# Patient Record
Sex: Female | Born: 1940 | Race: White | Hispanic: Yes
Health system: Southern US, Community
[De-identification: ages and names within clinical notes are randomized; demographics above are authoritative.]

## PROBLEM LIST (undated history)

## (undated) HISTORY — PX: BREAST EXCISIONAL BIOPSY: SUR124

---

## 2003-12-04 ENCOUNTER — Encounter: Admission: RE | Admit: 2003-12-04 | Discharge: 2003-12-04 | Payer: Self-pay | Admitting: Family Medicine

## 2005-02-10 ENCOUNTER — Encounter: Admission: RE | Admit: 2005-02-10 | Discharge: 2005-02-10 | Payer: Self-pay | Admitting: Family Medicine

## 2006-03-17 ENCOUNTER — Encounter: Admission: RE | Admit: 2006-03-17 | Discharge: 2006-03-17 | Payer: Self-pay | Admitting: Family Medicine

## 2006-04-11 ENCOUNTER — Encounter: Admission: RE | Admit: 2006-04-11 | Discharge: 2006-04-11 | Payer: Self-pay | Admitting: Family Medicine

## 2007-09-26 ENCOUNTER — Ambulatory Visit (HOSPITAL_COMMUNITY): Admission: RE | Admit: 2007-09-26 | Discharge: 2007-09-26 | Payer: Self-pay | Admitting: Family Medicine

## 2008-09-26 ENCOUNTER — Ambulatory Visit (HOSPITAL_COMMUNITY): Admission: RE | Admit: 2008-09-26 | Discharge: 2008-09-26 | Payer: Self-pay | Admitting: Family Medicine

## 2010-05-08 ENCOUNTER — Encounter: Admission: RE | Admit: 2010-05-08 | Discharge: 2010-05-08 | Payer: Self-pay | Admitting: Family Medicine

## 2010-12-26 ENCOUNTER — Encounter: Payer: Self-pay | Admitting: Family Medicine

## 2011-04-26 ENCOUNTER — Other Ambulatory Visit: Payer: Self-pay | Admitting: Family Medicine

## 2011-04-26 DIAGNOSIS — Z1231 Encounter for screening mammogram for malignant neoplasm of breast: Secondary | ICD-10-CM

## 2011-05-12 ENCOUNTER — Ambulatory Visit
Admission: RE | Admit: 2011-05-12 | Discharge: 2011-05-12 | Disposition: A | Discharge status: Home / Self Care | Payer: Medicare Other | Source: Ambulatory Visit | Attending: Family Medicine | Admitting: Family Medicine

## 2011-05-12 DIAGNOSIS — Z1231 Encounter for screening mammogram for malignant neoplasm of breast: Secondary | ICD-10-CM

## 2012-05-18 ENCOUNTER — Other Ambulatory Visit: Payer: Self-pay | Admitting: Family Medicine

## 2012-05-18 DIAGNOSIS — Z1231 Encounter for screening mammogram for malignant neoplasm of breast: Secondary | ICD-10-CM

## 2012-05-31 ENCOUNTER — Ambulatory Visit
Admission: RE | Admit: 2012-05-31 | Discharge: 2012-05-31 | Disposition: A | Discharge status: Home / Self Care | Payer: Medicare PPO | Source: Ambulatory Visit | Attending: Family Medicine | Admitting: Family Medicine

## 2012-05-31 DIAGNOSIS — Z1231 Encounter for screening mammogram for malignant neoplasm of breast: Secondary | ICD-10-CM

## 2012-06-01 ENCOUNTER — Other Ambulatory Visit: Payer: Self-pay | Admitting: Family Medicine

## 2012-06-01 DIAGNOSIS — Z780 Asymptomatic menopausal state: Secondary | ICD-10-CM

## 2012-06-01 DIAGNOSIS — M810 Age-related osteoporosis without current pathological fracture: Secondary | ICD-10-CM

## 2012-06-05 ENCOUNTER — Ambulatory Visit
Admission: RE | Admit: 2012-06-05 | Discharge: 2012-06-05 | Disposition: A | Discharge status: Home / Self Care | Payer: Medicare PPO | Source: Ambulatory Visit | Attending: Family Medicine | Admitting: Family Medicine

## 2012-06-05 DIAGNOSIS — Z780 Asymptomatic menopausal state: Secondary | ICD-10-CM

## 2012-06-05 DIAGNOSIS — M810 Age-related osteoporosis without current pathological fracture: Secondary | ICD-10-CM

## 2013-07-23 ENCOUNTER — Other Ambulatory Visit: Payer: Self-pay

## 2013-07-23 DIAGNOSIS — Z1231 Encounter for screening mammogram for malignant neoplasm of breast: Secondary | ICD-10-CM

## 2013-08-03 ENCOUNTER — Ambulatory Visit
Admission: RE | Admit: 2013-08-03 | Discharge: 2013-08-03 | Disposition: A | Discharge status: Home / Self Care | Payer: Medicare PPO | Source: Ambulatory Visit

## 2013-08-03 DIAGNOSIS — Z1231 Encounter for screening mammogram for malignant neoplasm of breast: Secondary | ICD-10-CM

## 2014-07-30 ENCOUNTER — Other Ambulatory Visit: Payer: Self-pay

## 2014-07-30 DIAGNOSIS — Z1231 Encounter for screening mammogram for malignant neoplasm of breast: Secondary | ICD-10-CM

## 2014-08-07 ENCOUNTER — Ambulatory Visit
Admission: RE | Admit: 2014-08-07 | Discharge: 2014-08-07 | Disposition: A | Discharge status: Home / Self Care | Payer: Medicare PPO | Source: Ambulatory Visit

## 2014-08-07 DIAGNOSIS — Z1231 Encounter for screening mammogram for malignant neoplasm of breast: Secondary | ICD-10-CM

## 2014-08-28 ENCOUNTER — Other Ambulatory Visit: Payer: Self-pay

## 2014-08-28 DIAGNOSIS — M810 Age-related osteoporosis without current pathological fracture: Secondary | ICD-10-CM

## 2014-08-30 ENCOUNTER — Other Ambulatory Visit: Payer: Self-pay | Admitting: Family Medicine

## 2014-08-30 DIAGNOSIS — M810 Age-related osteoporosis without current pathological fracture: Secondary | ICD-10-CM

## 2014-09-04 ENCOUNTER — Ambulatory Visit
Admission: RE | Admit: 2014-09-04 | Discharge: 2014-09-04 | Disposition: A | Discharge status: Home / Self Care | Payer: Medicare PPO | Source: Ambulatory Visit

## 2014-09-04 DIAGNOSIS — M810 Age-related osteoporosis without current pathological fracture: Secondary | ICD-10-CM

## 2015-07-15 ENCOUNTER — Other Ambulatory Visit: Payer: Self-pay

## 2015-07-15 DIAGNOSIS — Z1231 Encounter for screening mammogram for malignant neoplasm of breast: Secondary | ICD-10-CM

## 2015-08-13 ENCOUNTER — Ambulatory Visit
Admission: RE | Admit: 2015-08-13 | Discharge: 2015-08-13 | Disposition: A | Discharge status: Home / Self Care | Payer: Medicare PPO | Source: Ambulatory Visit

## 2015-08-13 DIAGNOSIS — Z1231 Encounter for screening mammogram for malignant neoplasm of breast: Secondary | ICD-10-CM

## 2016-08-02 ENCOUNTER — Other Ambulatory Visit: Payer: Self-pay | Admitting: Family Medicine

## 2016-08-02 DIAGNOSIS — Z1231 Encounter for screening mammogram for malignant neoplasm of breast: Secondary | ICD-10-CM

## 2016-08-24 ENCOUNTER — Ambulatory Visit
Admission: RE | Admit: 2016-08-24 | Discharge: 2016-08-24 | Disposition: A | Discharge status: Home / Self Care | Payer: Medicare PPO | Source: Ambulatory Visit | Attending: Family Medicine | Admitting: Family Medicine

## 2016-08-24 DIAGNOSIS — Z1231 Encounter for screening mammogram for malignant neoplasm of breast: Secondary | ICD-10-CM

## 2017-05-17 ENCOUNTER — Other Ambulatory Visit: Payer: Self-pay | Admitting: Family Medicine

## 2017-05-17 DIAGNOSIS — M8580 Other specified disorders of bone density and structure, unspecified site: Secondary | ICD-10-CM

## 2017-05-25 ENCOUNTER — Other Ambulatory Visit: Payer: Medicare PPO

## 2017-06-02 ENCOUNTER — Ambulatory Visit
Admission: RE | Admit: 2017-06-02 | Discharge: 2017-06-02 | Disposition: A | Discharge status: Home / Self Care | Payer: Medicare PPO | Source: Ambulatory Visit | Attending: Family Medicine | Admitting: Family Medicine

## 2017-06-02 DIAGNOSIS — M8580 Other specified disorders of bone density and structure, unspecified site: Secondary | ICD-10-CM

## 2017-08-15 ENCOUNTER — Other Ambulatory Visit: Payer: Self-pay | Admitting: Family Medicine

## 2017-08-15 DIAGNOSIS — Z1231 Encounter for screening mammogram for malignant neoplasm of breast: Secondary | ICD-10-CM

## 2017-08-26 ENCOUNTER — Ambulatory Visit
Admission: RE | Admit: 2017-08-26 | Discharge: 2017-08-26 | Disposition: A | Discharge status: Home / Self Care | Payer: Medicare PPO | Source: Ambulatory Visit | Attending: Family Medicine | Admitting: Family Medicine

## 2017-08-26 DIAGNOSIS — Z1231 Encounter for screening mammogram for malignant neoplasm of breast: Secondary | ICD-10-CM

## 2018-08-25 ENCOUNTER — Other Ambulatory Visit: Payer: Self-pay | Admitting: Internal Medicine

## 2018-08-25 DIAGNOSIS — Z1231 Encounter for screening mammogram for malignant neoplasm of breast: Secondary | ICD-10-CM

## 2018-09-22 ENCOUNTER — Ambulatory Visit
Admission: RE | Admit: 2018-09-22 | Discharge: 2018-09-22 | Disposition: A | Discharge status: Home / Self Care | Payer: Medicare PPO | Source: Ambulatory Visit | Attending: Internal Medicine | Admitting: Internal Medicine

## 2018-09-22 DIAGNOSIS — Z1231 Encounter for screening mammogram for malignant neoplasm of breast: Secondary | ICD-10-CM

## 2019-08-21 ENCOUNTER — Other Ambulatory Visit: Payer: Self-pay | Admitting: Internal Medicine

## 2019-08-21 DIAGNOSIS — Z1231 Encounter for screening mammogram for malignant neoplasm of breast: Secondary | ICD-10-CM

## 2019-10-08 ENCOUNTER — Ambulatory Visit
Admission: RE | Admit: 2019-10-08 | Discharge: 2019-10-08 | Disposition: A | Discharge status: Home / Self Care | Payer: Medicare PPO | Source: Ambulatory Visit | Attending: Internal Medicine | Admitting: Internal Medicine

## 2019-10-08 ENCOUNTER — Other Ambulatory Visit: Payer: Self-pay

## 2019-10-08 DIAGNOSIS — Z1231 Encounter for screening mammogram for malignant neoplasm of breast: Secondary | ICD-10-CM

## 2020-01-31 ENCOUNTER — Ambulatory Visit: Payer: Medicare PPO | Attending: Internal Medicine

## 2020-01-31 DIAGNOSIS — Z23 Encounter for immunization: Secondary | ICD-10-CM | POA: Insufficient documentation

## 2020-01-31 NOTE — Progress Notes (Signed)
   Covid-19 Vaccination Clinic  Name:  Alejandra Hurst    MRN: [id number] DOB: 1941/09/30  01/31/2020  Ms. Alejandra Hurst was observed post Covid-19 immunization for 15 minutes without incidence. She was provided with Vaccine Information Sheet and instruction to access the V-Safe system.   Ms. Alejandra Hurst was instructed to call 911 with any severe reactions post vaccine: Marland Kitchen Difficulty breathing  . Swelling of your face and throat  . A fast heartbeat  . A bad rash all over your body  . Dizziness and weakness    Immunizations Administered    Name Date Dose VIS Date Route   Pfizer COVID-19 Vaccine 01/31/2020  2:49 PM 0.3 mL 11/16/2019 Intramuscular   Manufacturer: [employer], [employer]   Lot: [id number]   NDC: [address]-7

## 2020-02-26 ENCOUNTER — Ambulatory Visit: Payer: Medicare PPO | Attending: Internal Medicine

## 2020-02-26 DIAGNOSIS — Z23 Encounter for immunization: Secondary | ICD-10-CM

## 2020-02-26 NOTE — Progress Notes (Signed)
   Covid-19 Vaccination Clinic  Name:  Alejandra Hurst    MRN: [id number] DOB: 1941/03/29  02/26/2020  Alejandra Hurst was observed post Covid-19 immunization for 15 minutes without incident. She was provided with Vaccine Information Sheet and instruction to access the V-Safe system.   Alejandra Hurst was instructed to call 911 with any severe reactions post vaccine: Marland Kitchen Difficulty breathing  . Swelling of face and throat  . A fast heartbeat  . A bad rash all over body  . Dizziness and weakness   Immunizations Administered    Name Date Dose VIS Date Route   Pfizer COVID-19 Vaccine 02/26/2020  2:46 PM 0.3 mL 11/16/2019 Intramuscular   Manufacturer: [employer], [employer]   Lot: [id number]   NDC: [address]-3

## 2021-03-18 ENCOUNTER — Other Ambulatory Visit: Payer: Self-pay | Admitting: Internal Medicine

## 2021-03-18 DIAGNOSIS — Z1231 Encounter for screening mammogram for malignant neoplasm of breast: Secondary | ICD-10-CM

## 2021-03-18 DIAGNOSIS — E2839 Other primary ovarian failure: Secondary | ICD-10-CM

## 2021-04-17 ENCOUNTER — Other Ambulatory Visit: Payer: Self-pay

## 2021-04-17 ENCOUNTER — Ambulatory Visit
Admission: RE | Admit: 2021-04-17 | Discharge: 2021-04-17 | Disposition: A | Discharge status: Home / Self Care | Payer: Medicare PPO | Source: Ambulatory Visit | Attending: Internal Medicine | Admitting: Internal Medicine

## 2021-04-17 DIAGNOSIS — Z1231 Encounter for screening mammogram for malignant neoplasm of breast: Secondary | ICD-10-CM

## 2021-04-17 DIAGNOSIS — E2839 Other primary ovarian failure: Secondary | ICD-10-CM

## 2021-08-04 ENCOUNTER — Ambulatory Visit: Payer: Medicare PPO

## 2022-04-05 ENCOUNTER — Other Ambulatory Visit: Payer: Self-pay | Admitting: Internal Medicine

## 2022-04-05 DIAGNOSIS — Z1231 Encounter for screening mammogram for malignant neoplasm of breast: Secondary | ICD-10-CM

## 2022-04-19 ENCOUNTER — Ambulatory Visit
Admission: RE | Admit: 2022-04-19 | Discharge: 2022-04-19 | Disposition: A | Discharge status: Home / Self Care | Payer: Medicare PPO | Source: Ambulatory Visit | Attending: Internal Medicine | Admitting: Internal Medicine

## 2022-04-19 ENCOUNTER — Other Ambulatory Visit: Payer: Self-pay | Admitting: Internal Medicine

## 2022-04-19 DIAGNOSIS — Z1231 Encounter for screening mammogram for malignant neoplasm of breast: Secondary | ICD-10-CM

## 2022-11-14 IMAGING — MG MM DIGITAL SCREENING BILAT W/ TOMO AND CAD
8 series · 9 of 24 positions shown · non-contrast
Comparison: Previous exam(s).

CLINICAL DATA: Screening.

EXAM:
DIGITAL SCREENING BILATERAL MAMMOGRAM WITH TOMOSYNTHESIS AND CAD
TECHNIQUE: Bilateral screening digital craniocaudal and mediolateral oblique
mammograms were obtained. Bilateral screening digital breast
tomosynthesis was performed. The images were evaluated with
computer-aided detection.

[L MLO synth-2D]
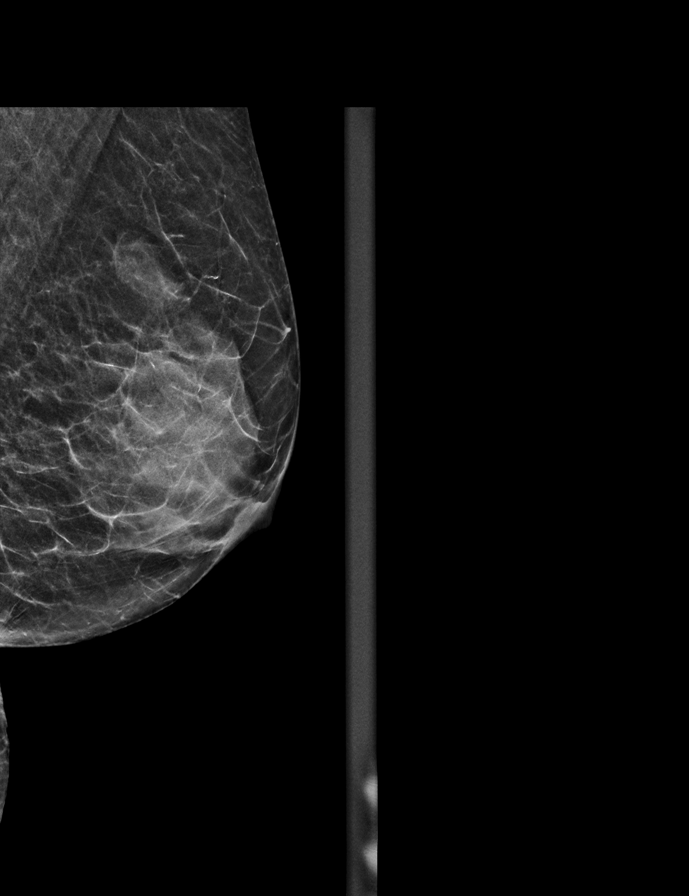

[R CC synth-2D]
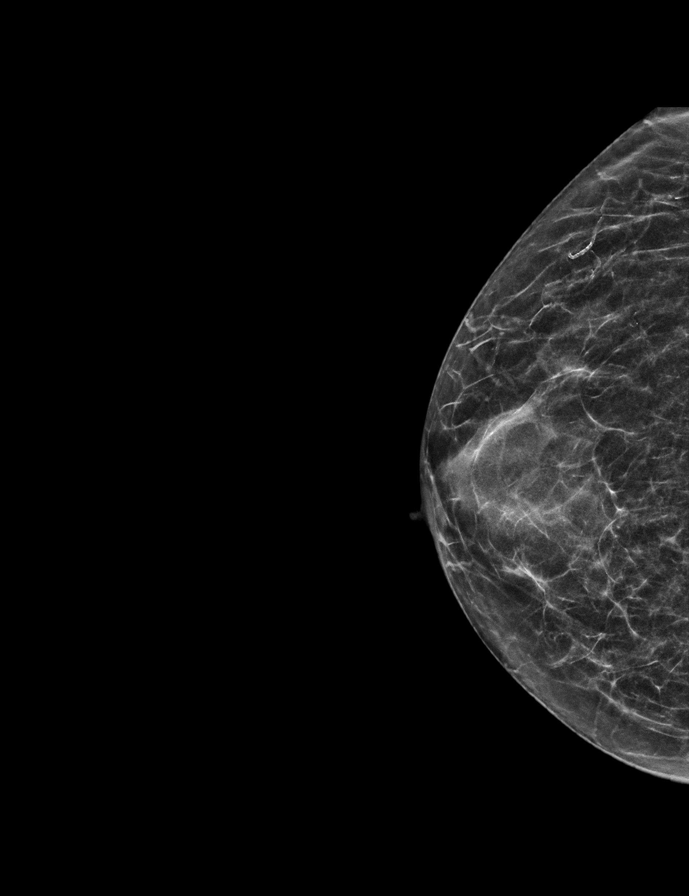

[L CC synth-2D]
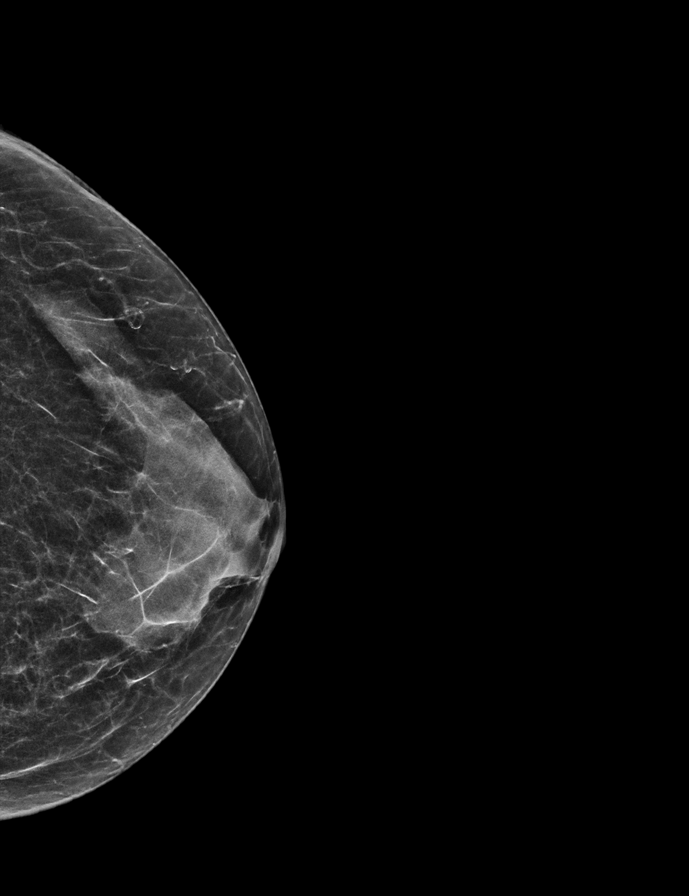

[R MLO synth-2D]
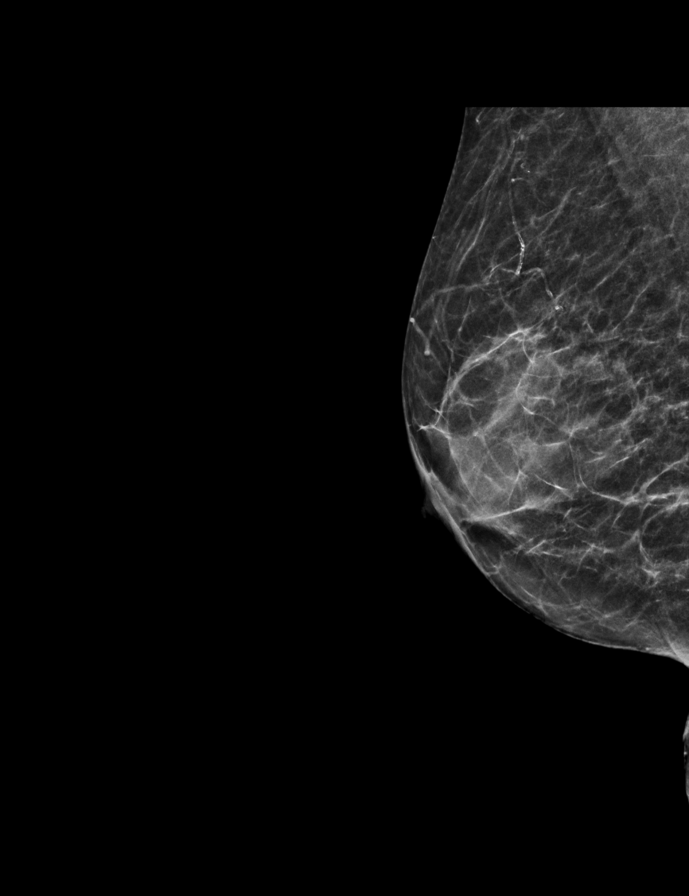

[L CC tomo · 2 of 44 frames shown]
[frame 15/44]
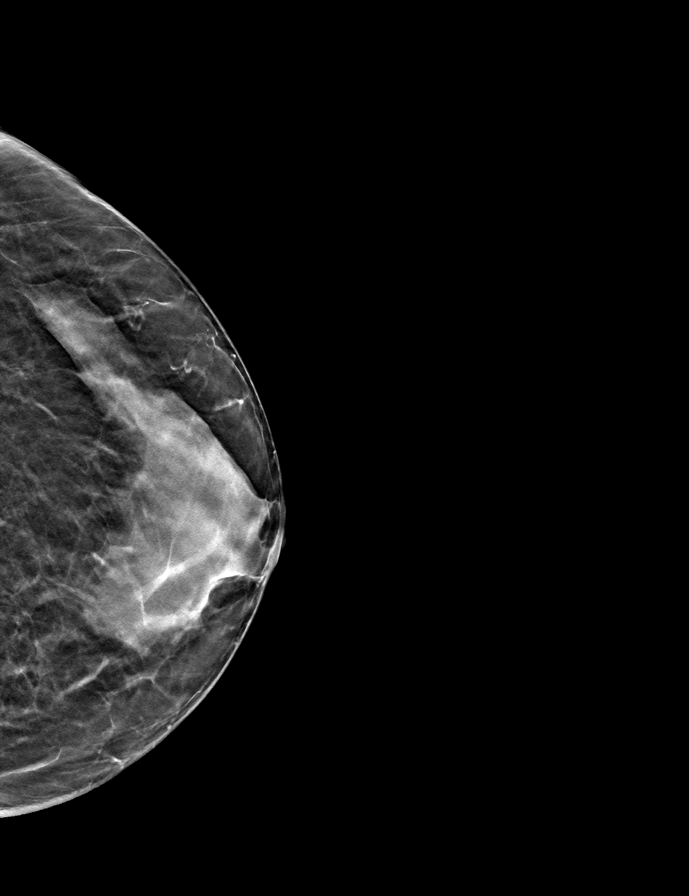
[frame 23/44]
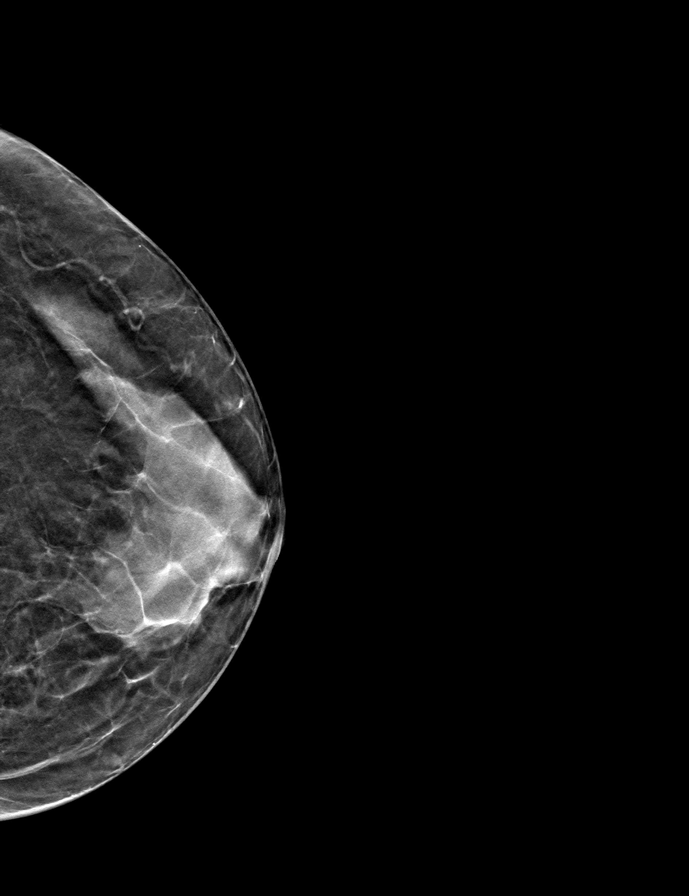

[R MLO tomo · tomo slice 22/43.0]
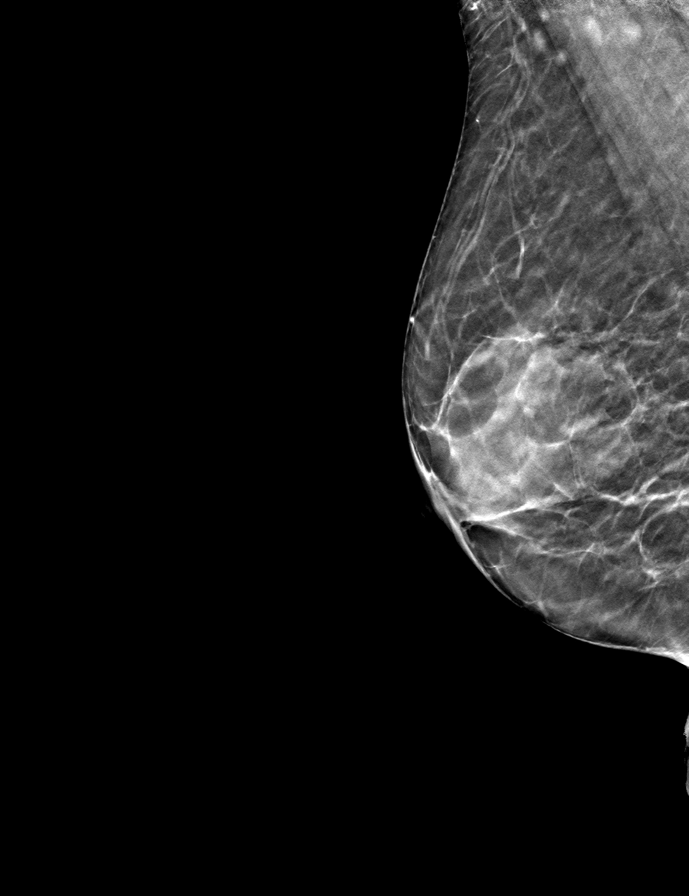

[L MLO tomo · tomo slice 22/43.0]
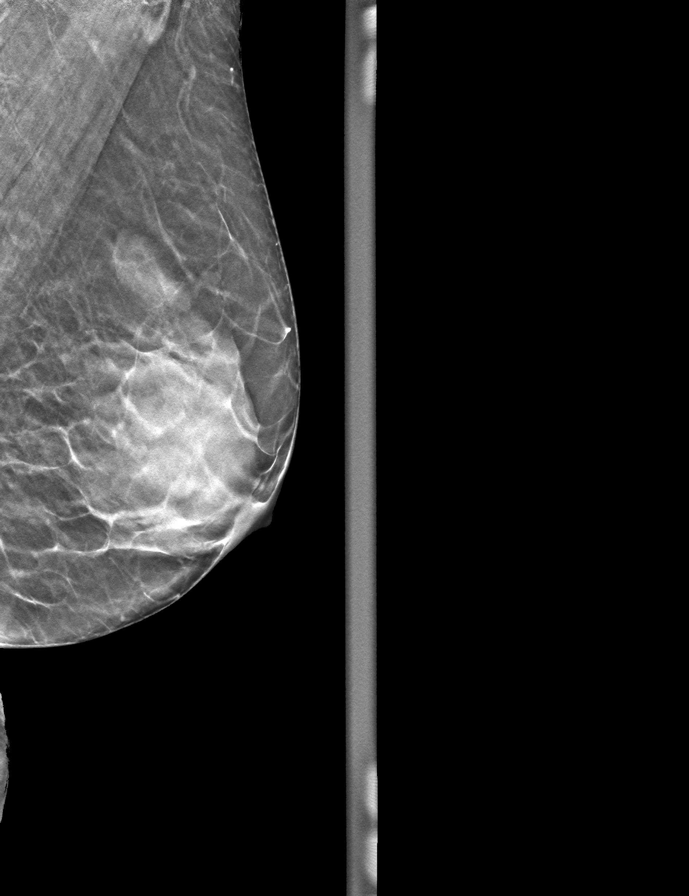

[R CC tomo · tomo slice 23/44.0]
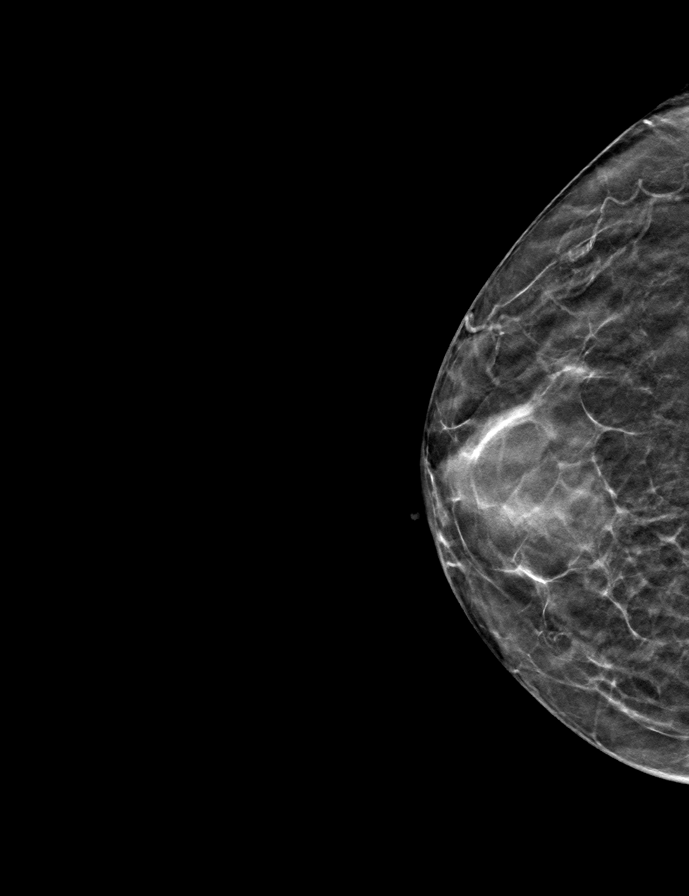

[9 of 24 positions shown; findings below may reference images not displayed]

ACR Breast Density Category c: The breast tissue is heterogeneously
dense, which may obscure small masses.
FINDINGS: There are no findings suspicious for malignancy. The images were
evaluated with computer-aided detection.
IMPRESSION: No mammographic evidence of malignancy. A result letter of this
screening mammogram will be mailed directly to the patient.

RECOMMENDATION:
Screening mammogram in one year. (Code:T4-5-GWO)

BI-RADS CATEGORY  1: Negative.

## 2023-03-16 ENCOUNTER — Encounter: Payer: Self-pay | Admitting: Internal Medicine

## 2023-03-16 DIAGNOSIS — Z1231 Encounter for screening mammogram for malignant neoplasm of breast: Secondary | ICD-10-CM

## 2023-03-29 ENCOUNTER — Other Ambulatory Visit: Payer: Self-pay | Admitting: Internal Medicine

## 2023-03-29 DIAGNOSIS — R2231 Localized swelling, mass and lump, right upper limb: Secondary | ICD-10-CM

## 2023-05-12 ENCOUNTER — Other Ambulatory Visit: Payer: Self-pay | Admitting: Internal Medicine

## 2023-05-12 ENCOUNTER — Ambulatory Visit
Admission: RE | Admit: 2023-05-12 | Discharge: 2023-05-12 | Disposition: A | Discharge status: Home / Self Care | Payer: Medicare PPO | Source: Ambulatory Visit | Attending: Internal Medicine | Admitting: Internal Medicine

## 2023-05-12 DIAGNOSIS — R2231 Localized swelling, mass and lump, right upper limb: Secondary | ICD-10-CM

## 2024-05-08 ENCOUNTER — Other Ambulatory Visit: Payer: Self-pay | Admitting: Internal Medicine

## 2024-05-08 DIAGNOSIS — Z1231 Encounter for screening mammogram for malignant neoplasm of breast: Secondary | ICD-10-CM

## 2024-05-30 ENCOUNTER — Ambulatory Visit
Admission: RE | Admit: 2024-05-30 | Discharge: 2024-05-30 | Disposition: A | Discharge status: Home / Self Care | Source: Ambulatory Visit | Attending: Internal Medicine | Admitting: Internal Medicine

## 2024-05-30 DIAGNOSIS — Z1231 Encounter for screening mammogram for malignant neoplasm of breast: Secondary | ICD-10-CM
